# Patient Record
Sex: Male | Born: 1965 | Race: White | Hispanic: No | Marital: Married | Smoking: Current every day smoker
Health system: Southern US, Community
[De-identification: ages and names within clinical notes are randomized; demographics above are authoritative.]

## PROBLEM LIST (undated history)

## (undated) ENCOUNTER — Ambulatory Visit

## (undated) HISTORY — PX: ANKLE SURGERY: SHX546

---

## 2005-05-16 ENCOUNTER — Ambulatory Visit: Payer: Self-pay

## 2015-02-06 ENCOUNTER — Encounter: Payer: Self-pay | Admitting: Emergency Medicine

## 2015-02-06 ENCOUNTER — Emergency Department
Admission: EM | Admit: 2015-02-06 | Discharge: 2015-02-07 | Disposition: A | Discharge status: Home / Self Care | Payer: Self-pay | Attending: Emergency Medicine | Admitting: Emergency Medicine

## 2015-02-06 DIAGNOSIS — K2920 Alcoholic gastritis without bleeding: Secondary | ICD-10-CM | POA: Insufficient documentation

## 2015-02-06 DIAGNOSIS — F1010 Alcohol abuse, uncomplicated: Secondary | ICD-10-CM

## 2015-02-06 DIAGNOSIS — F10229 Alcohol dependence with intoxication, unspecified: Secondary | ICD-10-CM | POA: Insufficient documentation

## 2015-02-06 DIAGNOSIS — Z720 Tobacco use: Secondary | ICD-10-CM | POA: Insufficient documentation

## 2015-02-06 DIAGNOSIS — Z79899 Other long term (current) drug therapy: Secondary | ICD-10-CM | POA: Insufficient documentation

## 2015-02-06 LAB — CBC
HEMATOCRIT: 45 % (ref 40.0–52.0)
HEMOGLOBIN: 15.4 g/dL (ref 13.0–18.0)
MCH: 32.2 pg (ref 26.0–34.0)
MCHC: 34.2 g/dL (ref 32.0–36.0)
MCV: 94.1 fL (ref 80.0–100.0)
Platelets: 316 10*3/uL (ref 150–440)
RBC: 4.78 MIL/uL (ref 4.40–5.90)
RDW: 13.8 % (ref 11.5–14.5)
WBC: 8.8 10*3/uL (ref 3.8–10.6)

## 2015-02-06 LAB — COMPREHENSIVE METABOLIC PANEL
ALBUMIN: 4.1 g/dL (ref 3.5–5.0)
ALT: 73 U/L — AB (ref 17–63)
AST: 84 U/L — AB (ref 15–41)
Alkaline Phosphatase: 57 U/L (ref 38–126)
Anion gap: 12 (ref 5–15)
BILIRUBIN TOTAL: 0.7 mg/dL (ref 0.3–1.2)
BUN: 8 mg/dL (ref 6–20)
CO2: 21 mmol/L — ABNORMAL LOW (ref 22–32)
CREATININE: 0.75 mg/dL (ref 0.61–1.24)
Calcium: 8.6 mg/dL — ABNORMAL LOW (ref 8.9–10.3)
Chloride: 99 mmol/L — ABNORMAL LOW (ref 101–111)
GFR calc Af Amer: >60 mL/min (ref >60)
GLUCOSE: 87 mg/dL (ref 65–99)
Potassium: 3.7 mmol/L (ref 3.5–5.1)
Sodium: 132 mmol/L — ABNORMAL LOW (ref 135–145)
TOTAL PROTEIN: 7.1 g/dL (ref 6.5–8.1)

## 2015-02-06 LAB — ETHANOL: Alcohol, Ethyl (B): 238 mg/dL — ABNORMAL HIGH (ref <5)

## 2015-02-06 LAB — LIPASE, BLOOD: LIPASE: 31 U/L (ref 22–51)

## 2015-02-06 MED ORDER — LORAZEPAM 2 MG PO TABS: 0.0000 mg | ORAL_TABLET | Freq: Four times a day (QID) | ORAL | Status: DC

## 2015-02-06 MED ORDER — PANTOPRAZOLE SODIUM 40 MG PO TBEC
40.0000 mg | DELAYED_RELEASE_TABLET | Freq: Every day | ORAL | Status: DC
  Administered 2015-02-06: 40 mg via ORAL
  Filled 2015-02-06: qty 1

## 2015-02-06 MED ORDER — PANTOPRAZOLE SODIUM 40 MG PO TBEC: 40.0000 mg | DELAYED_RELEASE_TABLET | Freq: Every day | ORAL | Status: DC

## 2015-02-06 MED ORDER — ONDANSETRON 4 MG PO TBDP
4.0000 mg | ORAL_TABLET | Freq: Once | ORAL | Status: AC
  Administered 2015-02-06: 4 mg via ORAL
  Filled 2015-02-06: qty 1

## 2015-02-06 MED ORDER — THIAMINE HCL 100 MG/ML IJ SOLN: 100.0000 mg | Freq: Every day | INTRAMUSCULAR | Status: DC

## 2015-02-06 MED ORDER — LORAZEPAM 2 MG PO TABS: 0.0000 mg | ORAL_TABLET | Freq: Two times a day (BID) | ORAL | Status: DC

## 2015-02-06 MED ORDER — VITAMIN B-1 100 MG PO TABS
100.0000 mg | ORAL_TABLET | Freq: Every day | ORAL | Status: DC
  Administered 2015-02-06: 100 mg via ORAL
  Filled 2015-02-06: qty 1

## 2015-02-06 NOTE — ED Notes (Signed)
Pt. Noted in room. No complaints or concerns voiced. No distress or abnormal behavior noted. Will continue to monitor with security cameras. Q 15 minute rounds continue. 

## 2015-02-06 NOTE — BH Assessment (Signed)
Assessment Note  Christian Stewart is an 49 y.o. male presenting voluntarily to the ED for alcohol detox.  Pt reports he has been drinking a six pack of beer on a daily basis for the past 23 years.  Pt did not report any major issues with withdrawals.  Pt denies any other alcohol/drug uses.  Pt denies SI/HI.  Axis I: Alcohol Abuse Axis II: Deferred Axis III: History reviewed. No pertinent past medical history. Axis IV: problems with primary support group Axis V: 61-70 mild symptoms  Past Medical History: History reviewed. No pertinent past medical history.  History reviewed. No pertinent past surgical history.  Family History: History reviewed. No pertinent family history.  Social History:  reports that he has been smoking.  He does not have any smokeless tobacco history on file. He reports that he drinks alcohol. His drug history is not on file.  Additional Social History:  Alcohol / Drug Use History of alcohol / drug use?: Yes Longest period of sobriety (when/how long): 1 day Substance #1 Name of Substance 1: ETOH 1 - Age of First Use: 22 1 - Amount (size/oz): 6 pack 1 - Frequency: dailly 1 - Duration: all day 1 - Last Use / Amount: today  CIWA: CIWA-Ar BP: 120/72 mmHg Pulse Rate: 94 Nausea and Vomiting: no nausea and no vomiting (Pt here for alcohol detox, reports drinks between 12-24 beers a day. last drink 0900. Pt denies any complaints at this time. ) Tactile Disturbances: none Tremor: no tremor Auditory Disturbances: not present Paroxysmal Sweats: no sweat visible Visual Disturbances: not present Anxiety: no anxiety, at ease Headache, Fullness in Head: none present Agitation: normal activity Orientation and Clouding of Sensorium: oriented and can do serial additions CIWA-Ar Total: 0 COWS:    Allergies: No Known Allergies  Home Medications:  (Not in a hospital admission)  OB/GYN Status:  No LMP for male patient.  General Assessment Data Location of  Assessment: [facility] ED TTS Assessment: In system Is this a Tele or Face-to-Face Assessment?: Face-to-Face Is this an Initial Assessment or a Re-assessment for this encounter?: Initial Assessment Marital status: Married Christian Stewart name: N/a Is patient pregnant?: No Pregnancy Status: No Living Arrangements: Spouse/significant other Can pt return to current living arrangement?: Yes Admission Status: Voluntary Is patient capable of signing voluntary admission?: Yes Referral Source: Self/Family/Friend Insurance type: None  Medical Screening Exam [facility] Walk-in ONLY) Medical Exam completed: Yes  Crisis Care Plan Living Arrangements: Spouse/significant other Name of Psychiatrist: None Name of Therapist: None  Education Status Is patient currently in school?: No Current Grade: NA Highest grade of school patient has completed: college Contact person: Doctor, general practice  Risk to self with the past 6 months Suicidal Ideation: No Has patient been a risk to self within the past 6 months prior to admission? : No Suicidal Intent: No Has patient had any suicidal intent within the past 6 months prior to admission? : No Is patient at risk for suicide?: No Suicidal Plan?: No Has patient had any suicidal plan within the past 6 months prior to admission? : No Access to Means: No What has been your use of drugs/alcohol within the last 12 months?: 6 pack of beer daily Previous Attempts/Gestures: No How many times?: 0 Other Self Harm Risks: None reported Triggers for Past Attempts: None known Intentional Self Injurious Behavior: None Family Suicide History: No Recent stressful life event(s): Other (Comment) (None reported) Persecutory voices/beliefs?: No Depression: No Depression Symptoms: Guilt Substance abuse history and/or treatment for substance abuse?:  No Suicide prevention information given to non-admitted patients: Not applicable  Risk to Others within the past 6 months Homicidal Ideation:  No Does patient have any lifetime risk of violence toward others beyond the six months prior to admission? : No Thoughts of Harm to Others: No Current Homicidal Intent: No Current Homicidal Plan: No Access to Homicidal Means: No Identified Victim: None reported History of harm to others?: No Assessment of Violence: On admission Violent Behavior Description: None reported Does patient have access to weapons?: No Criminal Charges Pending?: No Does patient have a court date: No Is patient on probation?: No  Psychosis Hallucinations: None noted Delusions: None noted  Mental Status Report Appearance/Hygiene: In scrubs Eye Contact: Good Motor Activity: Freedom of movement Speech: Logical/coherent Level of Consciousness: Alert Mood: Pleasant Affect: Appropriate to circumstance Anxiety Level: None Thought Processes: Coherent Judgement: Unimpaired Orientation: Person, Place, Time, Situation Obsessive Compulsive Thoughts/Behaviors: None  Cognitive Functioning Concentration: Normal Memory: Recent Intact IQ: Average Insight: Good Impulse Control: Good Appetite: Good Weight Loss: 0 Weight Gain: 0 Sleep: No Change Total Hours of Sleep: 8 Vegetative Symptoms: None  ADLScreening [facility] Assessment Services) Patient's cognitive ability adequate to safely complete daily activities?: Yes Patient able to express need for assistance with ADLs?: Yes Independently performs ADLs?: Yes (appropriate for developmental age)  Prior Inpatient Therapy Prior Inpatient Therapy: No  Prior Outpatient Therapy Prior Outpatient Therapy: No Does patient have an ACCT team?: No Does patient have Intensive In-House Services?  : No Does patient have [facility] services? : No Does patient have P4CC services?: No  ADL Screening (condition at time of admission) Patient's cognitive ability adequate to safely complete daily activities?: Yes Patient able to express need for assistance with ADLs?:  Yes Independently performs ADLs?: Yes (appropriate for developmental age)       Abuse/Neglect Assessment (Assessment to be complete while patient is alone) Physical Abuse: Denies Verbal Abuse: Denies Sexual Abuse: Denies Exploitation of patient/patient's resources: Denies Self-Neglect: Denies Values / Beliefs Cultural Requests During Hospitalization: None Spiritual Requests During Hospitalization: None Consults Spiritual Care Consult Needed: No Social Work Consult Needed: No Merchant navy officer (For Healthcare) Does patient have an advance directive?: No Would patient like information on creating an advanced directive?: Yes English as a second language teacher given    Additional Information 1:1 In Past 12 Months?: No CIRT Risk: No Elopement Risk: No Does patient have medical clearance?: Yes     Disposition:  Disposition Initial Assessment Completed for this Encounter: Yes Disposition of Patient: Other dispositions, Referred to Other disposition(s): Referred to outside facility Patient referred to: [facility]  On Site Evaluation by:   Reviewed with Physician:    [place] 02/06/2015 8:24 PM

## 2015-02-06 NOTE — ED Notes (Signed)
Detox from alcohol. Last drink 1500

## 2015-02-06 NOTE — Discharge Instructions (Signed)
Alcohol Withdrawal  Anytime drug use is interfering with normal living activities it has become abuse. This includes problems with family and friends. Psychological dependence has developed when your mind tells you that the drug is needed. This is usually followed by physical dependence when a continuing increase of drugs are required to get the same feeling or "high." This is known as addiction or chemical dependency. A person's risk is much higher if there is a history of chemical dependency in the family.  Mild Withdrawal Following Stopping Alcohol, When Addiction or Chemical Dependency Has Developed  When a person has developed tolerance to alcohol, any sudden stopping of alcohol can cause uncomfortable physical symptoms. Most of the time these are mild and consist of tremors in the hands and increases in heart rate, breathing, and temperature. Sometimes these symptoms are associated with anxiety, panic attacks, and bad dreams. There may also be stomach upset. Normal sleep patterns are often interrupted with periods of inability to sleep (insomnia). This may last for 6 months. Because of this discomfort, many people choose to continue drinking to get rid of this discomfort and to try to feel normal.  Severe Withdrawal with Decreased or No Alcohol Intake, When Addiction or Chemical Dependency Has Developed  About five percent of alcoholics will develop signs of severe withdrawal when they stop using alcohol. One sign of this is development of generalized seizures (convulsions). Other signs of this are severe agitation and confusion. This may be associated with believing in things which are not real or seeing things which are not really there (delusions and hallucinations). Vitamin deficiencies are usually present if alcohol intake has been long-term. Treatment for this most often requires hospitalization and close observation.  Addiction can only be helped by stopping use of all chemicals. This is hard but may  save your life. With continual alcohol use, possible outcomes are usually loss of self respect and esteem, violence, and death.  Addiction cannot be cured but it can be stopped. This often requires outside help and the care of professionals. Treatment centers are listed in the yellow pages under Cocaine, Narcotics, and Alcoholics Anonymous. Most hospitals and clinics can refer you to a specialized care center.  It is not necessary for you to go through the uncomfortable symptoms of withdrawal. Your caregiver can provide you with medicines that will help you through this difficult period. Try to avoid situations, friends, or drugs that made it possible for you to keep using alcohol in the past. Learn how to say no.  It takes a long period of time to overcome addictions to all drugs, including alcohol. There may be many times when you feel as though you want a drink. After getting rid of the physical addiction and withdrawal, you will have a lessening of the craving which tells you that you need alcohol to feel normal. Call your caregiver if more support is needed. Learn who to talk to in your family and among your friends so that during these periods you can receive outside help. Alcoholics Anonymous (AA) has helped many people over the years. To get further help, contact AA or call your caregiver, counselor, or clergyperson. Al-Anon and Alateen are support groups for friends and family members of an alcoholic. The people who love and care for an alcoholic often need help, too. For information about these organizations, check your phone directory or call a local alcoholism treatment center.   SEEK IMMEDIATE MEDICAL CARE IF:    You have a seizure.     You have a fever.   You experience uncontrolled vomiting or you vomit up blood. This may be bright red or look like black coffee grounds.   You have blood in the stool. This may be bright red or appear as a black, tarry, bad-smelling stool.   You become lightheaded or  faint. Do not drive if you feel this way. Have someone else drive you or call 911 for help.   You become more agitated or confused.   You develop uncontrolled anxiety.   You begin to see things that are not really there (hallucinate).  Your caregiver has determined that you completely understand your medical condition, and that your mental state is back to normal. You understand that you have been treated for alcohol withdrawal, have agreed not to drink any alcohol for a minimum of 1 day, will not operate a car or other machinery for 24 hours, and have had an opportunity to ask any questions about your condition.  Document Released: 03/12/2005 Document Revised: 08/25/2011 Document Reviewed: 01/19/2008  ExitCare Patient Information 2015 [employer]. This information is not intended to replace advice given to you by your health care provider. Make sure you discuss any questions you have with your health care provider.    Gastritis, Adult  Gastritis is soreness and swelling (inflammation) of the lining of the stomach. Gastritis can develop as a sudden onset (acute) or long-term (chronic) condition. If gastritis is not treated, it can lead to stomach bleeding and ulcers.  CAUSES   Gastritis occurs when the stomach lining is weak or damaged. Digestive juices from the stomach then inflame the weakened stomach lining. The stomach lining may be weak or damaged due to viral or bacterial infections. One common bacterial infection is the Helicobacter pylori infection. Gastritis can also result from excessive alcohol consumption, taking certain medicines, or having too much acid in the stomach.   SYMPTOMS   In some cases, there are no symptoms. When symptoms are present, they may include:   Pain or a burning sensation in the upper abdomen.   Nausea.   Vomiting.   An uncomfortable feeling of fullness after eating.  DIAGNOSIS   Your caregiver may suspect you have gastritis based on your symptoms and a physical exam. To  determine the cause of your gastritis, your caregiver may perform the following:   Blood or stool tests to check for the H pylori bacterium.   Gastroscopy. A thin, flexible tube (endoscope) is passed down the esophagus and into the stomach. The endoscope has a light and camera on the end. Your caregiver uses the endoscope to view the inside of the stomach.   Taking a tissue sample (biopsy) from the stomach to examine under a microscope.  TREATMENT   Depending on the cause of your gastritis, medicines may be prescribed. If you have a bacterial infection, such as an H pylori infection, antibiotics may be given. If your gastritis is caused by too much acid in the stomach, H2 blockers or antacids may be given. Your caregiver may recommend that you stop taking aspirin, ibuprofen, or other nonsteroidal anti-inflammatory drugs (NSAIDs).  HOME CARE INSTRUCTIONS   Only take over-the-counter or prescription medicines as directed by your caregiver.   If you were given antibiotic medicines, take them as directed. Finish them even if you start to feel better.   Drink enough fluids to keep your urine clear or pale yellow.   Avoid foods and drinks that make your symptoms worse, such as:     Caffeine or alcoholic drinks.   Chocolate.   Peppermint or mint flavorings.   Garlic and onions.   Spicy foods.   Citrus fruits, such as oranges, lemons, or limes.   Tomato-based foods such as sauce, chili, salsa, and pizza.   Fried and fatty foods.   Eat small, frequent meals instead of large meals.  SEEK IMMEDIATE MEDICAL CARE IF:    You have black or dark red stools.   You vomit blood or material that looks like coffee grounds.   You are unable to keep fluids down.   Your abdominal pain gets worse.   You have a fever.   You do not feel better after 1 week.   You have any other questions or concerns.  MAKE SURE YOU:   Understand these instructions.   Will watch your condition.   Will get help right away if you are not  doing well or get worse.  Document Released: 05/27/2001 Document Revised: 12/02/2011 Document Reviewed: 07/16/2011  ExitCare Patient Information 2015 [employer]. This information is not intended to replace advice given to you by your health care provider. Make sure you discuss any questions you have with your health care provider.

## 2015-02-06 NOTE — ED Notes (Signed)

## 2015-02-06 NOTE — ED Provider Notes (Signed)
[facility] Emergency Department Provider Note  ____________________________________________  Time seen: Approximately 7:18 PM  I have reviewed the triage vital signs and the nursing notes.   HISTORY  Chief Complaint Alcohol addiction   HPI Christian Stewart is a 49 y.o. male denies medical history. He presents todayfor concerns that he needs to go through detox from alcohol use. He discussed with his wife, and they've concluded that he needs to have an inpatient detox as he drinks upwards of 12 beers per day. He last drank around 3 PM today. He has not yet experienced any seizures in his history, he denies withdrawal symptoms now or the shakes. No hallucinations. No desire to hurt himself or others.  Patient also reports that for the last couple of months he has noticed pain in his upper abdomen that wakes him up at about 4 in the morning. He has occasionally caused him to vomit, denies any blood in his vomit or dark stools. He is concerned that he is developed stomach upset from his drinking.   History reviewed. No pertinent past medical history.  There are no active problems to display for this patient.   History reviewed. No pertinent past surgical history.  Current Outpatient Rx  Name  Route  Sig  Dispense  Refill  . pantoprazole (PROTONIX) 40 MG tablet   Oral   Take 1 tablet (40 mg total) by mouth daily.   30 tablet   1     Allergies Review of patient's allergies indicates no known allergies.  History reviewed. No pertinent family history.  Social History Social History  Substance Use Topics  . Smoking status: Current Every Day Smoker  . Smokeless tobacco: None  . Alcohol Use: Yes    Review of Systems Constitutional: No fever/chills Eyes: No visual changes. ENT: No sore throat. Cardiovascular: Denies chest pain. Respiratory: Denies shortness of breath. Gastrointestinal: No abdominal pain now, but strikes him and his stomach at about  4 AM on a somewhat daily basis for the last 2-3 months.  No nausea, no vomiting.  No diarrhea.  No constipation. Genitourinary: Negative for dysuria. Musculoskeletal: Negative for back pain. Skin: Negative for rash. Neurological: Negative for headaches, focal weakness or numbness.  10-point ROS otherwise negative.  ____________________________________________   PHYSICAL EXAM:  VITAL SIGNS: ED Triage Vitals  Enc Vitals Group     BP 02/06/15 1758 112/77 mmHg     Pulse Rate 02/06/15 1758 96     Resp 02/06/15 1758 18     Temp 02/06/15 1758 98.1 F (36.7 C)     Temp Source 02/06/15 1758 Oral     SpO2 02/06/15 1758 96 %     Weight 02/06/15 1758 170 lb (77.111 kg)     Height 02/06/15 1758 5\' 11"  (1.803 m)     Head Cir --      Peak Flow --      Pain Score --      Pain Loc --      Pain Edu? --      Excl. in GC? --     Constitutional: Alert and oriented. Well appearing and in no acute distress. Eyes: Conjunctivae are normal. PERRL. EOMI. Head: Atraumatic. Nose: No congestion/rhinnorhea. Mouth/Throat: Mucous membranes are moist.  Oropharynx non-erythematous. Neck: No stridor.   Cardiovascular: Normal rate, regular rhythm. Grossly normal heart sounds.  Good peripheral circulation. Respiratory: Normal respiratory effort.  No retractions. Lungs CTAB. Gastrointestinal: Soft and nontender. No distention. No abdominal bruits. No CVA  tenderness. Musculoskeletal: No lower extremity tenderness nor edema.  No joint effusions. Neurologic:  Normal speech and language. No gross focal neurologic deficits are appreciated. No gait instability. Skin:  Skin is warm, dry and intact. No rash noted. Psychiatric: Mood and affect are normal. Speech and behavior are normal. Denies hallucinations, and he desires to hurt himself, or thoughts of hurting others. He is not suicidal.  ____________________________________________   LABS (all labs ordered are listed, but only abnormal results are  displayed)  Labs Reviewed  COMPREHENSIVE METABOLIC PANEL - Abnormal; Notable for the following:    Sodium 132 (*)    Chloride 99 (*)    CO2 21 (*)    Calcium 8.6 (*)    AST 84 (*)    ALT 73 (*)    All other components within normal limits  ETHANOL - Abnormal; Notable for the following:    Alcohol, Ethyl (B) 238 (*)    All other components within normal limits  CBC  LIPASE, BLOOD  ETHANOL   ____________________________________________  EKG   ____________________________________________  RADIOLOGY   ____________________________________________   PROCEDURES  Procedure(s) performed: None  Critical Care performed: No  ____________________________________________   INITIAL IMPRESSION / ASSESSMENT AND PLAN / ED COURSE  Pertinent labs & imaging results that were available during my care of the patient were reviewed by me and considered in my medical decision making (see chart for details).  Patient's abdominal pain is most likely consistent with alcoholic gastritis versus less likely pancreatitis or possibly gastric ulcer induced by his alcoholism. His exam is reassuring without pain or discomfort at this time. Regarding his alcohol use, I will consult TTS for detox options. He presently has a which all score of 0, showing no evidence of withdrawals. We will place him on withdrawal protocol, as we continue to await options. I will check basic labs because of his report of upper abdominal pain including lipase.  ----------------------------------------- 8:10 PM on 02/06/2015 -----------------------------------------  Patient was seen by TTS, they advised the patient can go to [facility] where there is an open and available detox bed awaiting him. The patient reports that this would work well for him, his wife is able to drive him there. Patient agrees not to drive himself.  ----------------------------------------- 10:59 PM on  02/06/2015 -----------------------------------------  Patient agreeable with plan to go to [facility] this evening. We do need to wait for his alcohol level to be less than 100, and will recheck at 2 AM.  We contact [facility], there able to take him now. We'll discharge the patient [facility]. ____________________________________________   FINAL CLINICAL IMPRESSION(S) / ED DIAGNOSES  Final diagnoses:  Alcoholic gastritis  Alcohol abuse      Sharyn Creamer, MD 02/06/15 [phone]

## 2015-02-06 NOTE — ED Notes (Signed)
Report called to RN Jillyn Hidden in [place]. Pt to move to [facility] with ED Tech Russ A.

## 2015-02-06 NOTE — ED Notes (Signed)
Pt. To [facility] from ED ambulatory without difficulty, to room 3 . Report from Beth RN. Pt. Is alert and oriented, warm and dry in no distress. Pt. Denies SI, HI, and AVH. Pt. Calm and cooperative. Pt. Made aware of security cameras and Q15 minute rounds. Pt. Encouraged to let Nursing staff know of any concerns or needs.  

## 2015-02-07 NOTE — ED Notes (Signed)
Pt. Noted sleeping in room. No complaints or concerns voiced. No distress or abnormal behavior noted. Will continue to monitor with security cameras. Q 15 minute rounds continue. 

## 2016-07-30 ENCOUNTER — Encounter (HOSPITAL_COMMUNITY): Payer: Self-pay | Admitting: Emergency Medicine

## 2016-07-30 ENCOUNTER — Ambulatory Visit (HOSPITAL_COMMUNITY)
Admission: EM | Admit: 2016-07-30 | Discharge: 2016-07-30 | Disposition: A | Discharge status: Home / Self Care | Payer: 59 | Attending: Family Medicine | Admitting: Family Medicine

## 2016-07-30 DIAGNOSIS — H61102 Unspecified noninfective disorders of pinna, left ear: Secondary | ICD-10-CM

## 2016-07-30 MED ORDER — CEPHALEXIN 500 MG PO CAPS: 500.0000 mg | ORAL_CAPSULE | Freq: Four times a day (QID) | ORAL | 0 refills | Status: DC

## 2016-07-30 NOTE — Discharge Instructions (Signed)
Apply heat and take antibiotic and call specialist for further care.

## 2016-07-30 NOTE — ED Triage Notes (Signed)
[facility] a [facility] of [facility] possible cyst on his left ear x 3 days.

## 2016-07-30 NOTE — ED Provider Notes (Signed)
[facility]    CSN: [id number] Arrival date & time: 07/30/16  1821     History   Chief Complaint Chief Complaint  Patient presents with  . Cyst    HPI Christian Stewart is a 51 y.o. male.    Rash  Location:  Head/neck Head/neck rash location:  L ear Quality: painful and swelling   Pain details:    Severity:  Mild   Duration:  5 days   Progression:  Worsening Severity:  Mild Onset quality:  Gradual Duration:  5 days Progression:  Worsening Chronicity:  New   History reviewed. No pertinent past medical history.  There are no active problems to display for this patient.   History reviewed. No pertinent surgical history.     Home Medications    Prior to Admission medications   Not on File    Family History History reviewed. No pertinent family history.  Social History Social History  Substance Use Topics  . Smoking status: Current Every Day Smoker  . Smokeless tobacco: Not on file  . Alcohol use Yes     Allergies   Patient has no known allergies.   Review of Systems Review of Systems  HENT: Positive for ear pain.   Skin: Positive for rash.  All other systems reviewed and are negative.    Physical Exam Triage Vital Signs ED Triage Vitals  Enc Vitals Group     BP 07/30/16 1858 151/89     Pulse Rate 07/30/16 1858 80     Resp 07/30/16 1858 18     Temp 07/30/16 1858 97.3 F (36.3 C)     Temp Source 07/30/16 1858 Oral     SpO2 07/30/16 1858 99 %     Weight --      Height --      Head Circumference --      Peak Flow --      Pain Score 07/30/16 1857 0     Pain Loc --      Pain Edu? --      Excl. in GC? --    No data found.   Updated Vital Signs BP 151/89 (BP Location: Right Arm)   Pulse 80   Temp 97.3 F (36.3 C) (Oral)   Resp 18   SpO2 99%   Visual Acuity Right Eye Distance:   Left Eye Distance:   Bilateral Distance:    Right Eye Near:   Left Eye Near:    Bilateral Near:     Physical Exam    Constitutional: He appears well-developed and well-nourished.  Neck: Normal range of motion. Neck supple.  Lymphadenopathy:    He has no cervical adenopathy.  Skin: Skin is warm. There is erythema.  Tender 1cm cystic lesion to lower pole of left pinna  Nursing note and vitals reviewed.    UC Treatments / Results  Labs (all labs ordered are listed, but only abnormal results are displayed) Labs Reviewed - No data to display  EKG  EKG Interpretation None       Radiology No results found.  Procedures Procedures (including critical care time)  Medications Ordered in UC Medications - No data to display   Initial Impression / Assessment and Plan / UC Course  I have reviewed the triage vital signs and the nursing notes.  Pertinent labs & imaging results that were available during my care of the patient were reviewed by me and considered in my medical decision making (see chart for details).  Final Clinical Impressions(s) / UC Diagnoses   Final diagnoses:  None    New Prescriptions New Prescriptions   No medications on file     Linna Hoff, MD 07/30/16 1946

## 2016-10-31 ENCOUNTER — Emergency Department (HOSPITAL_COMMUNITY): Payer: 59

## 2016-10-31 ENCOUNTER — Emergency Department (HOSPITAL_COMMUNITY)
Admission: EM | Admit: 2016-10-31 | Discharge: 2016-10-31 | Discharge status: Against Medical Advice | Payer: 59 | Attending: Emergency Medicine | Admitting: Emergency Medicine

## 2016-10-31 ENCOUNTER — Encounter (HOSPITAL_COMMUNITY): Payer: Self-pay | Admitting: Emergency Medicine

## 2016-10-31 DIAGNOSIS — M545 Low back pain, unspecified: Secondary | ICD-10-CM

## 2016-10-31 DIAGNOSIS — F17200 Nicotine dependence, unspecified, uncomplicated: Secondary | ICD-10-CM | POA: Diagnosis not present

## 2016-10-31 NOTE — ED Provider Notes (Signed)
[facility] Provider Note   CSN: [id number] Arrival date & time: 10/31/16  1253     History   Chief Complaint Chief Complaint  Patient presents with  . Back Pain    HPI Christian Stewart is a 51 y.o. male.  The history is provided by the patient. No language interpreter was used.  Back Pain   This is a new problem. Episode onset: 4 days ago. The problem occurs constantly. The problem has not changed since onset.Associated with: lifting right leg to put on sock. The pain is present in the lumbar spine. The quality of the pain is described as aching. The pain does not radiate. The pain is at a severity of 10/10 (in the morning or at rest). Exacerbated by: rest. Stiffness is present in the morning. Pertinent negatives include no chest pain, no fever, no numbness, no weight loss, no headaches, no abdominal pain, no abdominal swelling, no bowel incontinence, no perianal numbness, no bladder incontinence, no dysuria, no pelvic pain, no leg pain, no paresthesias, no paresis, no tingling and no weakness. He has tried NSAIDs for the symptoms. The treatment provided no relief. Risk factors: no hx of CA, no hx of IVDU.    History reviewed. No pertinent past medical history.  There are no active problems to display for this patient.   Past Surgical History:  Procedure Laterality Date  . ANKLE SURGERY         Home Medications    Prior to Admission medications   Medication Sig Start Date End Date Taking? Authorizing Provider  cephALEXin (KEFLEX) 500 MG capsule Take 1 capsule (500 mg total) by mouth 4 (four) times daily. 07/30/16   Linna HoffKindl, James D, MD    Family History Family History  Problem Relation Age of Onset  . Hypertension Father     Social History Social History  Substance Use Topics  . Smoking status: Current Every Day Smoker    Packs/day: 0.50  . Smokeless tobacco: Never Used  . Alcohol use Yes     Comment: weekly     Allergies   Patient has no known  allergies.   Review of Systems Review of Systems  Constitutional: Negative for fever and weight loss.  Cardiovascular: Negative for chest pain.  Gastrointestinal: Negative for abdominal pain and bowel incontinence.  Genitourinary: Negative for bladder incontinence, dysuria and pelvic pain.  Musculoskeletal: Positive for back pain.  Neurological: Negative for tingling, weakness, numbness, headaches and paresthesias.     Physical Exam Updated Vital Signs BP 121/81 (BP Location: Right Arm)   Pulse 82   Temp 97.7 F (36.5 C)   Resp 18   Wt 170 lb (77.1 kg)   SpO2 97%   BMI 23.71 kg/m   Physical Exam  Constitutional: He is oriented to person, place, and time. He appears well-developed and well-nourished. No distress.  Well appearing  HENT:  Head: Normocephalic and atraumatic.  Nose: Nose normal.  Mouth/Throat: Oropharynx is clear and moist.  Eyes: EOM are normal. Pupils are equal, round, and reactive to light.  Neck: Normal range of motion. No JVD present.  Normal ROM, no neck tenderness. No nuchal rigidity  Cardiovascular: Normal rate, normal heart sounds and intact distal pulses.   No murmur heard. Pulmonary/Chest: Effort normal and breath sounds normal. No respiratory distress. He has no wheezes. He has no rales.  Normal work of breathing  Abdominal: Soft. Bowel sounds are normal. There is no tenderness. There is no rebound and no guarding.  Soft and nontender. No rebound or guarding. No pulsatile mass noted.   Musculoskeletal: He exhibits tenderness. He exhibits no deformity.   No midline cervical, thoracic, or lumbar tenderness. Good ROM of spine. No deformity. No obvious wound, redness, or swelling noted.   Neurological: He is alert and oriented to person, place, and time.  Cranial Nerves:  III,IV, VI: ptosis not present, extra-ocular movements intact bilaterally, direct and consensual pupillary light reflexes intact bilaterally V: facial sensation, jaw opening, and  bite strength equal bilaterally VII: eyebrow raise, eyelid close, smile, frown, pucker equal bilaterally VIII: hearing grossly normal bilaterally  IX,X: palate elevation and swallowing intact XI: bilateral shoulder shrug and lateral head rotation equal and strong XII: midline tongue extension  Negative pronator drift, negative Romberg, negative RAM's, negative heel-to-shin, negative finger to nose.    Sensory intact.  Muscle strength 5/5 Patient able to ambulate without difficulty.   SLR Right -  neg SLR Left -  neg  Skin: Skin is warm.  Psychiatric: He has a normal mood and affect. His behavior is normal.  Nursing note and vitals reviewed.    ED Treatments / Results  Labs (all labs ordered are listed, but only abnormal results are displayed) Labs Reviewed - No data to display  EKG  EKG Interpretation None       Radiology No results found.  Procedures Procedures (including critical care time)  Medications Ordered in ED Medications - No data to display   Initial Impression / Assessment and Plan / ED Course  I have reviewed the triage vital signs and the nursing notes.  Pertinent labs & imaging results that were available during my care of the patient were reviewed by me and considered in my medical decision making (see chart for details).     Patient eloped. Patient was scheduled to have a lumbar x-ray done which was verbally understood and in agreement prior to ordering. Therefore not able to finish assessment. Patient was observed leaving the ED by nurse and questioned them on where they were going. There was acknowledgement however did not stop to speak with her.  Patient did not tolerate anyone he was leaving and therefore was not able to speak to him regarding the importance of getting the x-ray done and having the results back for further assessment.   Patient with back pain.  No neurological deficits and normal neuro exam.  Patient is ambulatory.  No loss of  bowel or bladder control.  No concern for cauda equina.  No fever, night sweats, weight loss, h/o cancer, IVDA, no recent procedure to back. No urinary symptoms suggestive of UTI.      Final Clinical Impressions(s) / ED Diagnoses   Final diagnoses:  Acute bilateral low back pain without sciatica    New Prescriptions Discharge Medication List as of 10/31/2016  2:42 PM       Alvina Chou, [place] 10/31/16 1459    Little, Ambrose Finland, MD 10/31/16 1743

## 2016-10-31 NOTE — ED Triage Notes (Signed)
Pt observed leaving the emergency department. Family acknowledged this RN but would not stop to speak with me.

## 2016-10-31 NOTE — ED Triage Notes (Signed)
Pt did not return to department. PA advised

## 2016-10-31 NOTE — ED Triage Notes (Signed)
Pt reports low back pain x 3 days. Denies trauma. Pain stated when he was lifting r/leg 3 days ago

## 2016-11-01 ENCOUNTER — Encounter (HOSPITAL_COMMUNITY): Payer: Self-pay | Admitting: Emergency Medicine

## 2016-11-01 ENCOUNTER — Emergency Department (HOSPITAL_COMMUNITY)
Admission: EM | Admit: 2016-11-01 | Discharge: 2016-11-01 | Disposition: A | Discharge status: Home / Self Care | Payer: 59 | Attending: Emergency Medicine | Admitting: Emergency Medicine

## 2016-11-01 DIAGNOSIS — X501XXA Overexertion from prolonged static or awkward postures, initial encounter: Secondary | ICD-10-CM | POA: Diagnosis not present

## 2016-11-01 DIAGNOSIS — Z79899 Other long term (current) drug therapy: Secondary | ICD-10-CM | POA: Insufficient documentation

## 2016-11-01 DIAGNOSIS — Y999 Unspecified external cause status: Secondary | ICD-10-CM | POA: Insufficient documentation

## 2016-11-01 DIAGNOSIS — Y929 Unspecified place or not applicable: Secondary | ICD-10-CM | POA: Diagnosis not present

## 2016-11-01 DIAGNOSIS — S39012A Strain of muscle, fascia and tendon of lower back, initial encounter: Secondary | ICD-10-CM

## 2016-11-01 DIAGNOSIS — S3992XA Unspecified injury of lower back, initial encounter: Secondary | ICD-10-CM | POA: Diagnosis present

## 2016-11-01 DIAGNOSIS — Y939 Activity, unspecified: Secondary | ICD-10-CM | POA: Diagnosis not present

## 2016-11-01 DIAGNOSIS — F17200 Nicotine dependence, unspecified, uncomplicated: Secondary | ICD-10-CM | POA: Insufficient documentation

## 2016-11-01 MED ORDER — CYCLOBENZAPRINE HCL 10 MG PO TABS
5.0000 mg | ORAL_TABLET | Freq: Once | ORAL | Status: AC
  Administered 2016-11-01: 5 mg via ORAL
  Filled 2016-11-01: qty 1

## 2016-11-01 MED ORDER — CYCLOBENZAPRINE HCL 10 MG PO TABS: 10.0000 mg | ORAL_TABLET | Freq: Two times a day (BID) | ORAL | 0 refills | Status: DC | PRN

## 2016-11-01 MED ORDER — IBUPROFEN 600 MG PO TABS: 600.0000 mg | ORAL_TABLET | Freq: Four times a day (QID) | ORAL | 0 refills | Status: DC | PRN

## 2016-11-01 MED ORDER — PREDNISONE 20 MG PO TABS: ORAL_TABLET | ORAL | 0 refills | Status: DC

## 2016-11-01 MED ORDER — IBUPROFEN 800 MG PO TABS
800.0000 mg | ORAL_TABLET | Freq: Once | ORAL | Status: AC
  Administered 2016-11-01: 800 mg via ORAL
  Filled 2016-11-01: qty 1

## 2016-11-01 NOTE — ED Triage Notes (Signed)
Pt. Stated, I bent over to put my sock on Monday and it must have twicked my back, can't hardly stand to walk. [facility] and was only given Ibuprofen.

## 2016-11-01 NOTE — ED Notes (Signed)
Declined W/C at D/C and was escorted to lobby by RN. 

## 2016-11-01 NOTE — ED Provider Notes (Signed)
[facility] Provider Note    By signing my name below, I, Earmon Phoenix, attest that this documentation has been prepared under the direction and in the presence of Fayrene Helper, PA-C. Electronically Signed: Earmon Phoenix, ED Scribe. 11/01/16. 11:24 AM.   History   Chief Complaint Chief Complaint  Patient presents with  . Back Pain    The history is provided by the patient and medical records. No language interpreter was used.    Christian Stewart is a 51 y.o. male who presents to the Emergency Department complaining of low back pain that began five days ago. He rates the pain at 10/10 and describes it as soreness. He states he was putting on his socks when he moved the wrong way causing the pain to start. Pt was seen at [facility] yesterday but eloped from the department prior to having the scheduled imaging that was agreed upon during exam. He states he went to a chiropractor yesterday and was told L-1, L-2 and L-3 were out of alignment. He has taken Ibuprofen and has been wearing a back brace for pain with no significant relief. Moving increases the pain. He denies alleviating factors. He denies fever, chills, nausea, vomiting, bowel or bladder incontinence, numbness, tingling or weakness of any extremity. He denies h/o IV drug use or cancer.   History reviewed. No pertinent past medical history.  There are no active problems to display for this patient.   Past Surgical History:  Procedure Laterality Date  . ANKLE SURGERY         Home Medications    Prior to Admission medications   Medication Sig Start Date End Date Taking? Authorizing Provider  cephALEXin (KEFLEX) 500 MG capsule Take 1 capsule (500 mg total) by mouth 4 (four) times daily. 07/30/16   Linna Hoff, MD  cyclobenzaprine (FLEXERIL) 10 MG tablet Take 1 tablet (10 mg total) by mouth 2 (two) times daily as needed for muscle spasms. 11/01/16   Fayrene Helper, PA-C  ibuprofen (ADVIL,MOTRIN) 600 MG tablet Take 1  tablet (600 mg total) by mouth every 6 (six) hours as needed. 11/01/16   Fayrene Helper, PA-C  predniSONE (DELTASONE) 20 MG tablet 2 tabs po daily x 4 days 11/01/16   Fayrene Helper, PA-C    Family History Family History  Problem Relation Age of Onset  . Hypertension Father     Social History Social History  Substance Use Topics  . Smoking status: Current Every Day Smoker    Packs/day: 0.50  . Smokeless tobacco: Never Used  . Alcohol use Yes     Comment: weekly     Allergies   Patient has no known allergies.   Review of Systems Review of Systems  Constitutional: Negative for chills and fever.  Gastrointestinal: Negative for nausea and vomiting.       No bowel or bladder incontinence  Musculoskeletal: Positive for back pain.  Neurological: Negative for weakness and numbness.     Physical Exam Updated Vital Signs BP (!) 168/106 (BP Location: Right Arm)   Pulse 77   Temp 97.8 F (36.6 C) (Oral)   Resp 16   Ht 5\' 11"  (1.803 m)   Wt 170 lb (77.1 kg)   SpO2 100%   BMI 23.71 kg/m   Physical Exam  Constitutional: He is oriented to person, place, and time. He appears well-developed and well-nourished.  HENT:  Head: Normocephalic and atraumatic.  Neck: Normal range of motion.  Cardiovascular: Normal rate.   DP pulses intact  bilaterally.  Pulmonary/Chest: Effort normal.  Musculoskeletal: Normal range of motion. He exhibits tenderness. He exhibits no edema or deformity.  TTP to L-4 and L-5 of the lumbar spine. No crepitus, no step off, no overlying skin changes.  Neurological: He is alert and oriented to person, place, and time.  Patellar DTRs intact bilaterally. Strength 5/5 to BLE. Sensations intact.  Skin: Skin is warm and dry.  Psychiatric: He has a normal mood and affect. His behavior is normal.  Nursing note and vitals reviewed.    ED Treatments / Results  DIAGNOSTIC STUDIES: Oxygen Saturation is 100% on RA, normal by my interpretation.   COORDINATION OF  CARE: 11:21 AM- Will prescribe steroid, muscle relaxer and Ibuprofen. Recommended heat/ice therapy. Will refer to orthopedist. Will provide work note. Pt verbalizes understanding and agrees to plan.  Medications - No data to display  Labs (all labs ordered are listed, but only abnormal results are displayed) Labs Reviewed - No data to display  EKG  EKG Interpretation None       Radiology No results found.  Procedures Procedures (including critical care time)  Medications Ordered in ED Medications - No data to display   Initial Impression / Assessment and Plan / ED Course  I have reviewed the triage vital signs and the nursing notes.  Pertinent labs & imaging results that were available during my care of the patient were reviewed by me and considered in my medical decision making (see chart for details).     Patient with back pain. No neurological deficits and normal neuro exam. Patient is ambulatory. No loss of bowel or bladder control. No concern for cauda equina.  No fever, night sweats, weight loss, h/o cancer, IVDA, no recent procedure to back. No urinary symptoms suggestive of UTI. Supportive care and return precaution discussed. Appears safe for discharge at this time. Follow up as indicated in discharge paperwork.    Final Clinical Impressions(s) / ED Diagnoses   Final diagnoses:  Strain of lumbar region, initial encounter    New Prescriptions New Prescriptions   CYCLOBENZAPRINE (FLEXERIL) 10 MG TABLET    Take 1 tablet (10 mg total) by mouth 2 (two) times daily as needed for muscle spasms.   IBUPROFEN (ADVIL,MOTRIN) 600 MG TABLET    Take 1 tablet (600 mg total) by mouth every 6 (six) hours as needed.   PREDNISONE (DELTASONE) 20 MG TABLET    2 tabs po daily x 4 days    I personally performed the services described in this documentation, which was scribed in my presence. The recorded information has been reviewed and is accurate.       Fayrene Helperran, Jazyah Butsch,  PA-C 11/01/16 1127    Rolan BuccoBelfi, Melanie, MD 11/01/16 [phone]

## 2016-11-04 ENCOUNTER — Encounter (INDEPENDENT_AMBULATORY_CARE_PROVIDER_SITE_OTHER): Payer: Self-pay | Admitting: Specialist

## 2016-11-04 ENCOUNTER — Ambulatory Visit (INDEPENDENT_AMBULATORY_CARE_PROVIDER_SITE_OTHER): Payer: 59 | Admitting: Specialist

## 2016-11-04 ENCOUNTER — Ambulatory Visit (INDEPENDENT_AMBULATORY_CARE_PROVIDER_SITE_OTHER): Payer: 59

## 2016-11-04 VITALS — BP 128/82 | HR 101 | Ht 71.0 in | Wt 170.0 lb

## 2016-11-04 DIAGNOSIS — M545 Low back pain: Secondary | ICD-10-CM | POA: Diagnosis not present

## 2016-11-04 MED ORDER — CYCLOBENZAPRINE HCL 10 MG PO TABS: 10.0000 mg | ORAL_TABLET | Freq: Two times a day (BID) | ORAL | 0 refills | Status: DC | PRN

## 2016-11-04 MED ORDER — TRAMADOL HCL 50 MG PO TABS: 50.0000 mg | ORAL_TABLET | Freq: Four times a day (QID) | ORAL | 0 refills | Status: DC | PRN

## 2016-11-04 NOTE — Patient Instructions (Addendum)
Avoid bending, stooping and avoid lifting weights greater than 10 lbs. Avoid prolong standing and walking. Avoid frequent bending and stooping  No lifting greater than 10 lbs. May use ice or moist heat for pain. Weight loss is of benefit. Physical therapy is ordered.

## 2016-11-04 NOTE — Progress Notes (Signed)
Office Visit Note   Patient: Christian Stewart           Date of Birth: November 24, 1965           MRN: [id number] Visit Date: 11/04/2016              Requested by: No referring provider defined for this encounter. PCP: Patient, No Pcp Per   Assessment & Plan: Visit Diagnoses:  1. Acute midline low back pain, with sciatica presence unspecified     Plan: Avoid bending, stooping and avoid lifting weights greater than 10 lbs. Avoid prolong standing and walking. Avoid frequent bending and stooping  No lifting greater than 10 lbs. May use ice or moist heat for pain. Weight loss is of benefit.  Follow-Up Instructions: No Follow-up on file.   Orders:  Orders Placed This Encounter  Procedures  . XR Lumbar Spine 2-3 Views   No orders of the defined types were placed in this encounter.     Procedures: No procedures performed   Clinical Data: No additional findings.   Subjective: Chief Complaint  Patient presents with  . Lower Back - Pain    DOI: 10/27/16 when he twisted wrong way while putting on sock--    51 year old male was bending over to tie his shoe getting ready for work last week and felt pain into his back pain is in the small of the back and not radiating into the legs. No numbness or paresthesias, no bowel or bladder. Pain is improving compared with this weekend. Was seen in the ER at [facility] on Saturday, was placed on prednisone, ibuprofen And cyclobenzaprine. Was at [facility] on Friday but not happy with care so he left and returned to [facility]. No history of scoliosis or back injury. Cough or sneeze with pain. No leg weakness. He works as a Development worker, community. Its a medium-heavy.    Review of Systems  Constitutional: Negative.  Negative for chills, diaphoresis and unexpected weight change.  HENT: Negative.  Negative for rhinorrhea, sinus pain and sinus pressure.   Eyes: Negative.   Respiratory: Negative.  Negative for  shortness of breath and wheezing.   Cardiovascular: Negative.  Negative for chest pain, palpitations and leg swelling.  Gastrointestinal: Negative.   Endocrine: Negative.   Genitourinary: Negative.   Musculoskeletal: Positive for back pain and gait problem.  Skin: Negative.   Allergic/Immunologic: Negative.   Neurological: Negative for weakness and numbness.  Hematological: Negative.  Negative for adenopathy. Does not bruise/bleed easily.  Psychiatric/Behavioral: Negative.      Objective: Vital Signs: BP 128/82 (BP Location: Left Arm, Patient Position: Sitting)   Pulse (!) 101   Ht 5\' 11"  (1.803 m)   Wt 170 lb (77.1 kg)   BMI 23.71 kg/m   Physical Exam  Constitutional: He is oriented to person, place, and time. He appears well-developed and well-nourished.  HENT:  Head: Normocephalic and atraumatic.  Eyes: EOM are normal. Pupils are equal, round, and reactive to light.  Neck: Normal range of motion. Neck supple.  Pulmonary/Chest: Effort normal and breath sounds normal.  Abdominal: Soft. Bowel sounds are normal.  Neurological: He is alert and oriented to person, place, and time.  Skin: Skin is warm and dry.  Psychiatric: He has a normal mood and affect. His behavior is normal. Judgment and thought content normal.    Back Exam   Tenderness  The patient is experiencing tenderness in the lumbar.  Range of Motion  Extension:  20 abnormal  Flexion: 80  Lateral Bend Right: normal  Lateral Bend Left: normal  Rotation Right: normal  Rotation Left: normal   Muscle Strength  Right Quadriceps:  5/5  Left Quadriceps:  5/5  Right Hamstrings:  5/5  Left Hamstrings:  5/5   Tests  Straight leg raise right: negative Straight leg raise left: negative  Reflexes  Patellar: normal Achilles: normal Babinski's sign: normal   Other  Toe Walk: normal Heel Walk: normal Sensation: normal Gait: normal  Erythema: no back redness Scars: absent      Specialty Comments:  No  specialty comments available.  Imaging: No results found.   PMFS History: There are no active problems to display for this patient.  No past medical history on file.  Family History  Problem Relation Age of Onset  . Hypertension Father     Past Surgical History:  Procedure Laterality Date  . ANKLE SURGERY     Social History   Occupational History  . Not on file.   Social History Main Topics  . Smoking status: Current Every Day Smoker    Packs/day: 0.50  . Smokeless tobacco: Never Used  . Alcohol use Yes     Comment: weekly  . Drug use: No  . Sexual activity: Not on file

## 2016-11-06 ENCOUNTER — Telehealth: Payer: Self-pay | Admitting: Physical Therapy

## 2016-11-06 NOTE — Telephone Encounter (Signed)
Attempted to reach patient on 11/04/16 & 11/06/16, phone out of service, unable to leave message

## 2016-11-26 ENCOUNTER — Ambulatory Visit (INDEPENDENT_AMBULATORY_CARE_PROVIDER_SITE_OTHER): Payer: 59 | Admitting: Specialist

## 2017-08-28 ENCOUNTER — Encounter: Payer: Self-pay | Admitting: Gastroenterology

## 2017-10-16 ENCOUNTER — Ambulatory Visit: Payer: 59 | Admitting: Gastroenterology

## 2018-07-02 ENCOUNTER — Telehealth: Payer: Self-pay | Admitting: Internal Medicine

## 2018-07-02 ENCOUNTER — Ambulatory Visit (INDEPENDENT_AMBULATORY_CARE_PROVIDER_SITE_OTHER)
Admission: RE | Admit: 2018-07-02 | Discharge: 2018-07-02 | Disposition: A | Discharge status: Home / Self Care | Payer: 59 | Source: Ambulatory Visit | Attending: Internal Medicine | Admitting: Internal Medicine

## 2018-07-02 ENCOUNTER — Encounter: Payer: Self-pay | Admitting: Internal Medicine

## 2018-07-02 ENCOUNTER — Ambulatory Visit: Payer: 59 | Admitting: Internal Medicine

## 2018-07-02 VITALS — BP 116/68 | HR 80 | Ht 71.0 in | Wt 186.4 lb

## 2018-07-02 DIAGNOSIS — R059 Cough, unspecified: Secondary | ICD-10-CM

## 2018-07-02 DIAGNOSIS — Z87891 Personal history of nicotine dependence: Secondary | ICD-10-CM

## 2018-07-02 DIAGNOSIS — R05 Cough: Secondary | ICD-10-CM

## 2018-07-02 DIAGNOSIS — R0989 Other specified symptoms and signs involving the circulatory and respiratory systems: Secondary | ICD-10-CM

## 2018-07-02 LAB — CBC WITH DIFFERENTIAL/PLATELET
BASOS PCT: 1 % (ref 0.0–3.0)
Basophils Absolute: 0.1 10*3/uL (ref 0.0–0.1)
EOS PCT: 3.3 % (ref 0.0–5.0)
Eosinophils Absolute: 0.3 10*3/uL (ref 0.0–0.7)
HCT: 45.9 % (ref 39.0–52.0)
Hemoglobin: 16 g/dL (ref 13.0–17.0)
Lymphocytes Relative: 31 % (ref 12.0–46.0)
Lymphs Abs: 3.1 10*3/uL (ref 0.7–4.0)
MCHC: 34.7 g/dL (ref 30.0–36.0)
MCV: 88.7 fl (ref 78.0–100.0)
Monocytes Absolute: 1.7 10*3/uL — ABNORMAL HIGH (ref 0.1–1.0)
Monocytes Relative: 17.1 % — ABNORMAL HIGH (ref 3.0–12.0)
Neutro Abs: 4.7 10*3/uL (ref 1.4–7.7)
Neutrophils Relative %: 47.6 % (ref 43.0–77.0)
PLATELETS: 478 10*3/uL — AB (ref 150.0–400.0)
RBC: 5.18 Mil/uL (ref 4.22–5.81)
RDW: 13 % (ref 11.5–15.5)
WBC: 9.9 10*3/uL (ref 4.0–10.5)

## 2018-07-02 LAB — NITRIC OXIDE: Nitric Oxide: 10

## 2018-07-02 MED ORDER — AZITHROMYCIN 250 MG PO TABS: ORAL_TABLET | ORAL | 0 refills | Status: DC

## 2018-07-02 MED ORDER — PREDNISONE 10 MG PO TABS: ORAL_TABLET | ORAL | 0 refills | Status: DC

## 2018-07-02 NOTE — Telephone Encounter (Signed)
Attempted to call pt but unable to reach. Left message for pt to return call. 

## 2018-07-02 NOTE — Patient Instructions (Addendum)
ICD-10-CM   1. History of smoking 30 or more pack years Z87.891   2. Cough R05   3. Pleural rub R09.89    Likely post viral reactive cough Rule out copd or pneumonia or pleurisy  PLAN  Do cbc with diff  Do CXR 2 view  Z pak  Please take prednisone 40 mg x1 day, then 30 mg x1 day, then 20 mg x1 day, then 10 mg x1 day, and then 5 mg x1 day and stop  Do PFT in 6 weeks  Followup PFT in 6 weeks Return to see me or nurse practitioner in 6 weeks

## 2018-07-02 NOTE — Progress Notes (Signed)
Subjective:    Patient ID: Christian Stewart, male    DOB: [birth year], 53 y.o.   MRN: [id number]  PCP Patient, No Pcp Per   HPI  IOV 07/02/2018  Chief Complaint  Patient presents with  . Consult    Pt is here today due to a cough which he has had for about 5-6 days which is productive and coughing up clear mucus. Pt denies any problems with SOB or CP.    Christian Stewart - 53 y.o.  -is a new evaluation for acute cough.  He presents with his friend Christian Stewart.  They both tell me that his father is a patient of Dr. Sandrea Hughs for chronic cough and COPD.  Patient has an acute cough and his father was very concerned and therefore insisted patient come to pulmonary clinic.  Patient does not have a primary care physician.  He works as a Psychologist, occupational and is a heavy smoker.  Apparently just over 2 weeks ago his significant other had sinusitis and then he picked up flulike symptoms with fever body aches runny nose.  This persisted for a week.  He did not take any antibiotics or antiviral agents for this.  Symptoms lasted a week and did settle and then a few days later and now ongoing for the last 5 days he has had severe cough.  His RSI cough score is 28.  Cough is present a day and night.  It is largely dry.  Associated with wheezing but no dyspnea.  Wakes him up in the middle of the night as well.  He is extremely bothered by the cough.   Dr Gretta Cool Reflux Symptom Index (> 13-15 suggestive of LPR cough) 0 -> 5  =  none ->severe problem 07/02/2018   Hoarseness of problem with voice 0  Clearing  Of Throat 3  Excess throat mucus or feeling of post nasal drip 3  Difficulty swallowing food, liquid or tablets 0  Cough after eating or lying down 5  Breathing difficulties or choking episodes 2  Troublesome or annoying cough 5  Sensation of something sticking in throat or lump in throat 5  Heartburn, chest pain, indigestion, or stomach acid coming up 5  TOTAL 28   No images to review   Results for  JWAUN, VANZANTE (MRN [id number]) as of 07/02/2018 10:27  Ref. Range 02/06/2015 19:13  Creatinine Latest Ref Range: 0.61 - 1.24 mg/dL 4.19   Results for KWEISI, KRIBS (MRN [id number]) as of 07/02/2018 10:27  Ref. Range 02/06/2015 19:13  Hemoglobin Latest Ref Range: 13.0 - 18.0 g/dL 35.3     has no past medical history on file.   reports that he has been smoking. He has a 30.00 pack-year smoking history. He has never used smokeless tobacco.  Past Surgical History:  Procedure Laterality Date  . ANKLE SURGERY      No Known Allergies   There is no immunization history on file for this patient.  Family History  Problem Relation Age of Onset  . Hypertension Father      Current Outpatient Medications:  .  dextromethorphan-guaiFENesin (MUCINEX DM) 30-600 MG 12hr tablet, Take 1 tablet by mouth 2 (two) times daily., Disp: , Rfl:    Review of Systems  Constitutional: Positive for fever. Negative for unexpected weight change.  HENT: Positive for sinus pressure, sore throat and trouble swallowing. Negative for congestion, dental problem, ear pain, nosebleeds, postnasal drip, rhinorrhea and sneezing.   Eyes: Negative for redness and  itching.  Respiratory: Positive for cough and wheezing. Negative for chest tightness and shortness of breath.   Cardiovascular: Negative for palpitations and leg swelling.  Gastrointestinal: Positive for nausea and vomiting.  Genitourinary: Negative for dysuria.  Musculoskeletal: Negative for joint swelling.  Skin: Negative for rash.  Allergic/Immunologic: Negative.  Negative for environmental allergies, food allergies and immunocompromised state.  Neurological: Negative for headaches.  Hematological: Does not bruise/bleed easily.  Psychiatric/Behavioral: Negative for dysphoric mood. The patient is not nervous/anxious.        Objective:   Physical Exam  Vitals:   07/02/18 1003  BP: 116/68  Pulse: 80  SpO2: 96%  Weight: 186 lb 6.4 oz (84.6 kg)    Height: 5\' 11"  (1.803 m)    Body mass index is 26 kg/m.   General Appearance:    Alert, cooperative, no distress, appears stated age - yes , sitting on - chair  Head:    Normocephalic, without obvious abnormality, atraumatic  Eyes:    PERRL, conjunctiva/corneas clear,  Ears:    Normal TM's and external ear canals, both ears  Nose:   Nares normal, septum midline, mucosa normal, no drainage    or sinus tenderness. OXYGEN ON no @ ra  Throat:   Lips, mucosa, and tongue normal; teeth and gums normal. Cyanosis on lips - no. SMells of tobacco +  Neck:   Supple, symmetrical, trachea midline, no adenopathy;    thyroid:  no enlargement/tenderness/nodules; no carotid   bruit or JVD  Back:     Symmetric, no curvature, ROM normal, no CVA tenderness  Lungs:     Distress - no , Wheeze no, Barrell Chest - no, Purse lip breathing - no, Crackles - no . R LUNG BASE - PLEURAL RUB v WHEEZE   Chest Wall:    No tenderness or deformity. Scars in chest no   Heart:    Regular rate and rhythm, S1 and S2 normal, no murmur, rub   or gallop  Breast Exam:    NOT DONE  Abdomen:     Soft, non-tender, bowel sounds active all four quadrants,    no masses, no organomegaly  Genitalia:   NOT DONE  Rectal:   NOT DONE  Extremities:   Extremities normal, atraumatic, Clubbing - no, Edema - no  Pulses:   2+ and symmetric all extremities  Skin:   Stigmata of Connective Tissue Disease - no  Lymph nodes:   Cervical, supraclavicular, and axillary nodes normal  Psychiatric:  Neurologic:   pleasant CNII-XII intact, normal strength, sensation  throughout             Assessment & Plan:   Patient Instructions     ICD-10-CM   1. History of smoking 30 or more pack years Z87.891   2. Cough R05   3. Pleural rub R09.89    Likely post viral reactive cough Rule out copd or pneumonia or pleurisy  PLAN  Do cbc with diff  Do CXR 2 view  Z pak  Please take prednisone 40 mg x1 day, then 30 mg x1 day, then 20 mg x1  day, then 10 mg x1 day, and then 5 mg x1 day and stop  Do PFT in 6 weeks  Followup PFT in 6 weeks Return to see me or nurse practitioner in 6 weeks       SIGNATURE    Dr. Kalman ShanMurali Vonita Calloway, M.D., F.C.C.P,  Pulmonary and Critical Care Medicine Staff Physician, [facility] Center Director - Interstitial Lung  Disease  Program  Pulmonary Fibrosis [facility] at [facility] [place], [place], [id number]  Pager: [phone], If no answer or between  15:00h - 7:00h: call [phone] Telephone: [phone]  10:47 AM 07/02/2018

## 2018-07-02 NOTE — Telephone Encounter (Signed)
LEt Christian Stewart know   ccbc normal cxr clear  No change in plan    SIGNATURE    Dr. Kalman Shan, M.D., F.C.C.P,  Pulmonary and Critical Care Medicine Staff Physician, [facility] Center Director - Interstitial Lung Disease  Program  Pulmonary Fibrosis [facility] at [facility] [place], [place], [address]  Pager: [phone], If no answer or between  15:00h - 7:00h: call [phone] Telephone: [phone]  4:46 PM 07/02/2018  g   LABS    PULMONARY No results for input(s): PHART, PCO2ART, PO2ART, HCO3, TCO2, O2SAT in the last 168 hours.  Invalid input(s): PCO2, PO2  CBC Recent Labs  Lab 07/02/18 1054  HGB 16.0  HCT 45.9  WBC 9.9  PLT 478.0*    COAGULATION No results for input(s): INR in the last 168 hours.  CARDIAC  No results for input(s): TROPONINI in the last 168 hours. No results for input(s): PROBNP in the last 168 hours.   CHEMISTRY No results for input(s): NA, K, CL, CO2, GLUCOSE, BUN, CREATININE, CALCIUM, MG, PHOS in the last 168 hours. CrCl cannot be calculated (Patient's most recent lab result is older than the maximum 21 days allowed.).   LIVER No results for input(s): AST, ALT, ALKPHOS, BILITOT, PROT, ALBUMIN, INR in the last 168 hours.   INFECTIOUS No results for input(s): LATICACIDVEN, PROCALCITON in the last 168 hours.   ENDOCRINE CBG (last 3)  No results for input(s): GLUCAP in the last 72 hours.       IMAGING x48h  - image(s) personally visualized  -   highlighted in bold Dg Chest 2 View  Result Date: 07/02/2018 CLINICAL DATA:  Cough for 5 days. EXAM: CHEST - 2 VIEW COMPARISON:  None. FINDINGS: The heart size and mediastinal contours are within normal limits. There is no focal infiltrate, pulmonary edema, or pleural effusion. The visualized skeletal structures are unremarkable. IMPRESSION: No active cardiopulmonary disease. Electronically Signed   By: Sherian Rein M.D.   On:  07/02/2018 13:41

## 2018-07-13 NOTE — Telephone Encounter (Signed)
ATC pt, no answer. Left message for pt to call back.  I wanted to make sure this was addresses before closing the encounter.

## 2018-07-16 NOTE — Telephone Encounter (Signed)
Attempted to call pt but unable to reach. Left message for pt to return call. 

## 2018-07-20 NOTE — Telephone Encounter (Signed)
Attempted to call pt but unable to reach.  Left a detailed message on pt's machine with the results of labwork and cxr. Nothing further needed.

## 2018-08-13 ENCOUNTER — Ambulatory Visit: Payer: 59 | Admitting: Nurse Practitioner

## 2020-02-17 ENCOUNTER — Other Ambulatory Visit: Payer: Self-pay

## 2020-02-17 ENCOUNTER — Other Ambulatory Visit: Payer: 59

## 2020-02-17 DIAGNOSIS — Z20822 Contact with and (suspected) exposure to covid-19: Secondary | ICD-10-CM

## 2020-02-18 LAB — NOVEL CORONAVIRUS, NAA: SARS-CoV-2, NAA: NOT DETECTED

## 2020-06-20 ENCOUNTER — Emergency Department (HOSPITAL_COMMUNITY): Payer: No Typology Code available for payment source

## 2020-06-20 ENCOUNTER — Encounter (HOSPITAL_COMMUNITY): Payer: Self-pay

## 2020-06-20 ENCOUNTER — Inpatient Hospital Stay (HOSPITAL_COMMUNITY)
Admission: EM | Admit: 2020-06-20 | Discharge: 2020-06-22 | DRG: 958 | Disposition: A | Discharge status: Home / Self Care | Payer: No Typology Code available for payment source | Attending: Surgery | Admitting: Surgery

## 2020-06-20 DIAGNOSIS — Z8249 Family history of ischemic heart disease and other diseases of the circulatory system: Secondary | ICD-10-CM

## 2020-06-20 DIAGNOSIS — S61411A Laceration without foreign body of right hand, initial encounter: Secondary | ICD-10-CM | POA: Diagnosis present

## 2020-06-20 DIAGNOSIS — Y92410 Unspecified street and highway as the place of occurrence of the external cause: Secondary | ICD-10-CM

## 2020-06-20 DIAGNOSIS — I739 Peripheral vascular disease, unspecified: Secondary | ICD-10-CM | POA: Diagnosis present

## 2020-06-20 DIAGNOSIS — S32019A Unspecified fracture of first lumbar vertebra, initial encounter for closed fracture: Secondary | ICD-10-CM | POA: Diagnosis present

## 2020-06-20 DIAGNOSIS — T797XXA Traumatic subcutaneous emphysema, initial encounter: Secondary | ICD-10-CM | POA: Diagnosis present

## 2020-06-20 DIAGNOSIS — S32029A Unspecified fracture of second lumbar vertebra, initial encounter for closed fracture: Secondary | ICD-10-CM | POA: Diagnosis present

## 2020-06-20 DIAGNOSIS — J9811 Atelectasis: Secondary | ICD-10-CM | POA: Diagnosis present

## 2020-06-20 DIAGNOSIS — S2242XA Multiple fractures of ribs, left side, initial encounter for closed fracture: Secondary | ICD-10-CM | POA: Diagnosis not present

## 2020-06-20 DIAGNOSIS — T1490XA Injury, unspecified, initial encounter: Secondary | ICD-10-CM | POA: Diagnosis not present

## 2020-06-20 DIAGNOSIS — S36892A Contusion of other intra-abdominal organs, initial encounter: Secondary | ICD-10-CM | POA: Diagnosis present

## 2020-06-20 DIAGNOSIS — Y903 Blood alcohol level of 60-79 mg/100 ml: Secondary | ICD-10-CM | POA: Diagnosis present

## 2020-06-20 DIAGNOSIS — F17210 Nicotine dependence, cigarettes, uncomplicated: Secondary | ICD-10-CM | POA: Diagnosis present

## 2020-06-20 DIAGNOSIS — S27322A Contusion of lung, bilateral, initial encounter: Secondary | ICD-10-CM | POA: Diagnosis present

## 2020-06-20 DIAGNOSIS — R58 Hemorrhage, not elsewhere classified: Secondary | ICD-10-CM | POA: Diagnosis present

## 2020-06-20 DIAGNOSIS — S22029A Unspecified fracture of second thoracic vertebra, initial encounter for closed fracture: Secondary | ICD-10-CM | POA: Diagnosis present

## 2020-06-20 DIAGNOSIS — S270XXA Traumatic pneumothorax, initial encounter: Secondary | ICD-10-CM

## 2020-06-20 DIAGNOSIS — Z23 Encounter for immunization: Secondary | ICD-10-CM

## 2020-06-20 DIAGNOSIS — Z20822 Contact with and (suspected) exposure to covid-19: Secondary | ICD-10-CM | POA: Diagnosis present

## 2020-06-20 DIAGNOSIS — F10129 Alcohol abuse with intoxication, unspecified: Secondary | ICD-10-CM | POA: Diagnosis present

## 2020-06-20 DIAGNOSIS — Z79899 Other long term (current) drug therapy: Secondary | ICD-10-CM

## 2020-06-20 LAB — CBC
HCT: 48.3 % (ref 39.0–52.0)
Hemoglobin: 16 g/dL (ref 13.0–17.0)
MCH: 31.4 pg (ref 26.0–34.0)
MCHC: 33.1 g/dL (ref 30.0–36.0)
MCV: 94.9 fL (ref 80.0–100.0)
Platelets: 316 10*3/uL (ref 150–400)
RBC: 5.09 MIL/uL (ref 4.22–5.81)
RDW: 13.5 % (ref 11.5–15.5)
WBC: 12.5 10*3/uL — ABNORMAL HIGH (ref 4.0–10.5)
nRBC: 0 % (ref 0.0–0.2)

## 2020-06-20 LAB — COMPREHENSIVE METABOLIC PANEL
ALT: 68 U/L — ABNORMAL HIGH (ref 0–44)
AST: 70 U/L — ABNORMAL HIGH (ref 15–41)
Albumin: 3.8 g/dL (ref 3.5–5.0)
Alkaline Phosphatase: 95 U/L (ref 38–126)
Anion gap: 13 (ref 5–15)
BUN: 6 mg/dL (ref 6–20)
CO2: 22 mmol/L (ref 22–32)
Calcium: 8.2 mg/dL — ABNORMAL LOW (ref 8.9–10.3)
Chloride: 95 mmol/L — ABNORMAL LOW (ref 98–111)
Creatinine, Ser: 0.91 mg/dL (ref 0.61–1.24)
GFR, Estimated: >60 mL/min (ref >60)
Glucose, Bld: 150 mg/dL — ABNORMAL HIGH (ref 70–99)
Potassium: 3.9 mmol/L (ref 3.5–5.1)
Sodium: 130 mmol/L — ABNORMAL LOW (ref 135–145)
Total Bilirubin: 0.5 mg/dL (ref 0.3–1.2)
Total Protein: 7.1 g/dL (ref 6.5–8.1)

## 2020-06-20 LAB — ETHANOL: Alcohol, Ethyl (B): 65 mg/dL — ABNORMAL HIGH (ref <10)

## 2020-06-20 MED ORDER — LIDOCAINE HCL (PF) 1 % IJ SOLN
10.0000 mL | Freq: Once | INTRAMUSCULAR | Status: AC
  Administered 2020-06-20: 10 mL
  Filled 2020-06-20: qty 10

## 2020-06-20 MED ORDER — TETANUS-DIPHTH-ACELL PERTUSSIS 5-2.5-18.5 LF-MCG/0.5 IM SUSY
0.5000 mL | PREFILLED_SYRINGE | Freq: Once | INTRAMUSCULAR | Status: AC
  Administered 2020-06-20: 0.5 mL via INTRAMUSCULAR
  Filled 2020-06-20: qty 0.5

## 2020-06-20 MED ORDER — IOHEXOL 300 MG/ML  SOLN
100.0000 mL | Freq: Once | INTRAMUSCULAR | Status: AC | PRN
  Administered 2020-06-20: 100 mL via INTRAVENOUS

## 2020-06-20 MED ORDER — HYDROMORPHONE HCL 1 MG/ML IJ SOLN
1.0000 mg | Freq: Once | INTRAMUSCULAR | Status: AC
  Administered 2020-06-20: 1 mg via INTRAVENOUS
  Filled 2020-06-20: qty 1

## 2020-06-20 NOTE — ED Notes (Signed)
c-collar applied in triage

## 2020-06-20 NOTE — ED Notes (Signed)
Patient transported to CT 
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
## 2020-06-20 NOTE — ED Notes (Signed)
In CT will update vital when back.
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
## 2020-06-21 ENCOUNTER — Inpatient Hospital Stay (HOSPITAL_COMMUNITY): Payer: No Typology Code available for payment source

## 2020-06-21 DIAGNOSIS — S32019A Unspecified fracture of first lumbar vertebra, initial encounter for closed fracture: Secondary | ICD-10-CM | POA: Diagnosis present

## 2020-06-21 DIAGNOSIS — S27322A Contusion of lung, bilateral, initial encounter: Secondary | ICD-10-CM | POA: Diagnosis present

## 2020-06-21 DIAGNOSIS — T797XXA Traumatic subcutaneous emphysema, initial encounter: Secondary | ICD-10-CM | POA: Diagnosis present

## 2020-06-21 DIAGNOSIS — S36892A Contusion of other intra-abdominal organs, initial encounter: Secondary | ICD-10-CM | POA: Diagnosis present

## 2020-06-21 DIAGNOSIS — Z79899 Other long term (current) drug therapy: Secondary | ICD-10-CM | POA: Diagnosis not present

## 2020-06-21 DIAGNOSIS — S22029A Unspecified fracture of second thoracic vertebra, initial encounter for closed fracture: Secondary | ICD-10-CM | POA: Diagnosis present

## 2020-06-21 DIAGNOSIS — J9811 Atelectasis: Secondary | ICD-10-CM | POA: Diagnosis present

## 2020-06-21 DIAGNOSIS — S2242XA Multiple fractures of ribs, left side, initial encounter for closed fracture: Secondary | ICD-10-CM | POA: Diagnosis present

## 2020-06-21 DIAGNOSIS — S32029A Unspecified fracture of second lumbar vertebra, initial encounter for closed fracture: Secondary | ICD-10-CM | POA: Diagnosis present

## 2020-06-21 DIAGNOSIS — I739 Peripheral vascular disease, unspecified: Secondary | ICD-10-CM | POA: Diagnosis present

## 2020-06-21 DIAGNOSIS — F10129 Alcohol abuse with intoxication, unspecified: Secondary | ICD-10-CM | POA: Diagnosis present

## 2020-06-21 DIAGNOSIS — Z20822 Contact with and (suspected) exposure to covid-19: Secondary | ICD-10-CM | POA: Diagnosis present

## 2020-06-21 DIAGNOSIS — R58 Hemorrhage, not elsewhere classified: Secondary | ICD-10-CM | POA: Diagnosis present

## 2020-06-21 DIAGNOSIS — Y903 Blood alcohol level of 60-79 mg/100 ml: Secondary | ICD-10-CM | POA: Diagnosis present

## 2020-06-21 DIAGNOSIS — Z23 Encounter for immunization: Secondary | ICD-10-CM | POA: Diagnosis not present

## 2020-06-21 DIAGNOSIS — T1490XA Injury, unspecified, initial encounter: Secondary | ICD-10-CM | POA: Diagnosis present

## 2020-06-21 DIAGNOSIS — F17210 Nicotine dependence, cigarettes, uncomplicated: Secondary | ICD-10-CM | POA: Diagnosis present

## 2020-06-21 DIAGNOSIS — Y92410 Unspecified street and highway as the place of occurrence of the external cause: Secondary | ICD-10-CM | POA: Diagnosis not present

## 2020-06-21 DIAGNOSIS — S61411A Laceration without foreign body of right hand, initial encounter: Secondary | ICD-10-CM | POA: Diagnosis present

## 2020-06-21 DIAGNOSIS — Z8249 Family history of ischemic heart disease and other diseases of the circulatory system: Secondary | ICD-10-CM | POA: Diagnosis not present

## 2020-06-21 LAB — CBC
HCT: 40.2 % (ref 39.0–52.0)
Hemoglobin: 14.2 g/dL (ref 13.0–17.0)
MCH: 32.1 pg (ref 26.0–34.0)
MCHC: 35.3 g/dL (ref 30.0–36.0)
MCV: 91 fL (ref 80.0–100.0)
Platelets: 273 10*3/uL (ref 150–400)
RBC: 4.42 MIL/uL (ref 4.22–5.81)
RDW: 13.6 % (ref 11.5–15.5)
WBC: 12.1 10*3/uL — ABNORMAL HIGH (ref 4.0–10.5)
nRBC: 0 % (ref 0.0–0.2)

## 2020-06-21 LAB — RESP PANEL BY RT-PCR (FLU A&B, COVID) ARPGX2
Influenza A by PCR: NEGATIVE
Influenza B by PCR: NEGATIVE
SARS Coronavirus 2 by RT PCR: NEGATIVE

## 2020-06-21 LAB — HIV ANTIBODY (ROUTINE TESTING W REFLEX): HIV Screen 4th Generation wRfx: NONREACTIVE

## 2020-06-21 LAB — PHOSPHORUS: Phosphorus: 3 mg/dL (ref 2.5–4.6)

## 2020-06-21 LAB — MAGNESIUM: Magnesium: 1.9 mg/dL (ref 1.7–2.4)

## 2020-06-21 MED ORDER — PROCHLORPERAZINE EDISYLATE 10 MG/2ML IJ SOLN: 5.0000 mg | Freq: Four times a day (QID) | INTRAMUSCULAR | Status: DC | PRN

## 2020-06-21 MED ORDER — ACETAMINOPHEN 325 MG PO TABS: 650.0000 mg | ORAL_TABLET | ORAL | Status: DC | PRN

## 2020-06-21 MED ORDER — LIDOCAINE 5 % EX PTCH
1.0000 | MEDICATED_PATCH | CUTANEOUS | Status: DC
  Administered 2020-06-21: 1 via TRANSDERMAL
  Filled 2020-06-21 (×2): qty 1

## 2020-06-21 MED ORDER — FOLIC ACID 1 MG PO TABS
1.0000 mg | ORAL_TABLET | Freq: Every day | ORAL | Status: DC
  Administered 2020-06-21: 1 mg via ORAL
  Filled 2020-06-21: qty 1

## 2020-06-21 MED ORDER — ENOXAPARIN SODIUM 30 MG/0.3ML ~~LOC~~ SOLN: 30.0000 mg | Freq: Two times a day (BID) | SUBCUTANEOUS | Status: DC

## 2020-06-21 MED ORDER — ONDANSETRON 4 MG PO TBDP: 4.0000 mg | ORAL_TABLET | Freq: Four times a day (QID) | ORAL | Status: DC | PRN

## 2020-06-21 MED ORDER — GUAIFENESIN ER 600 MG PO TB12
600.0000 mg | ORAL_TABLET | Freq: Two times a day (BID) | ORAL | Status: DC
Start: 2020-06-21 — End: 2020-06-22
  Administered 2020-06-21 (×2): 600 mg via ORAL
  Filled 2020-06-21 (×2): qty 1

## 2020-06-21 MED ORDER — MORPHINE SULFATE (PF) 2 MG/ML IV SOLN
1.0000 mg | INTRAVENOUS | Status: DC | PRN
  Administered 2020-06-21 (×2): 2 mg via INTRAVENOUS
  Filled 2020-06-21 (×3): qty 1

## 2020-06-21 MED ORDER — IBUPROFEN 400 MG PO TABS
400.0000 mg | ORAL_TABLET | Freq: Three times a day (TID) | ORAL | Status: DC
  Administered 2020-06-21 (×3): 400 mg via ORAL
  Filled 2020-06-21 (×3): qty 1

## 2020-06-21 MED ORDER — OXYCODONE HCL 5 MG PO TABS: 5.0000 mg | ORAL_TABLET | ORAL | Status: DC | PRN

## 2020-06-21 MED ORDER — THIAMINE HCL 100 MG PO TABS
100.0000 mg | ORAL_TABLET | Freq: Every day | ORAL | Status: DC
  Administered 2020-06-21: 100 mg via ORAL
  Filled 2020-06-21: qty 1

## 2020-06-21 MED ORDER — THIAMINE HCL 100 MG/ML IJ SOLN: 100.0000 mg | Freq: Every day | INTRAMUSCULAR | Status: DC

## 2020-06-21 MED ORDER — HYDRALAZINE HCL 25 MG PO TABS: 25.0000 mg | ORAL_TABLET | Freq: Four times a day (QID) | ORAL | Status: DC | PRN

## 2020-06-21 MED ORDER — PANTOPRAZOLE SODIUM 40 MG PO TBEC
40.0000 mg | DELAYED_RELEASE_TABLET | Freq: Every day | ORAL | Status: DC
  Administered 2020-06-21: 40 mg via ORAL
  Filled 2020-06-21: qty 1

## 2020-06-21 MED ORDER — ADULT MULTIVITAMIN W/MINERALS CH
1.0000 | ORAL_TABLET | Freq: Every day | ORAL | Status: DC
  Administered 2020-06-21: 1 via ORAL
  Filled 2020-06-21: qty 1

## 2020-06-21 MED ORDER — SODIUM CHLORIDE 0.9% FLUSH: 3.0000 mL | INTRAVENOUS | Status: DC | PRN

## 2020-06-21 MED ORDER — PROCHLORPERAZINE MALEATE 10 MG PO TABS
10.0000 mg | ORAL_TABLET | Freq: Four times a day (QID) | ORAL | Status: DC | PRN
  Filled 2020-06-21: qty 1

## 2020-06-21 MED ORDER — DIPHENHYDRAMINE HCL 25 MG PO CAPS: 25.0000 mg | ORAL_CAPSULE | Freq: Four times a day (QID) | ORAL | Status: DC | PRN

## 2020-06-21 MED ORDER — IBUPROFEN 400 MG PO TABS: 400.0000 mg | ORAL_TABLET | Freq: Four times a day (QID) | ORAL | Status: DC | PRN

## 2020-06-21 MED ORDER — BISACODYL 10 MG RE SUPP
10.0000 mg | Freq: Every day | RECTAL | Status: DC | PRN
Start: 2020-06-21 — End: 2020-06-22

## 2020-06-21 MED ORDER — LORAZEPAM 2 MG/ML IJ SOLN: 1.0000 mg | INTRAMUSCULAR | Status: DC | PRN

## 2020-06-21 MED ORDER — PANTOPRAZOLE SODIUM 40 MG IV SOLR: 40.0000 mg | Freq: Every day | INTRAVENOUS | Status: DC

## 2020-06-21 MED ORDER — SODIUM CHLORIDE 0.9 % IV SOLN: 250.0000 mL | INTRAVENOUS | Status: DC | PRN

## 2020-06-21 MED ORDER — ONDANSETRON HCL 4 MG/2ML IJ SOLN: 4.0000 mg | Freq: Four times a day (QID) | INTRAMUSCULAR | Status: DC | PRN

## 2020-06-21 MED ORDER — SODIUM CHLORIDE 0.9% FLUSH
3.0000 mL | Freq: Two times a day (BID) | INTRAVENOUS | Status: DC
  Administered 2020-06-21 (×3): 3 mL via INTRAVENOUS

## 2020-06-21 MED ORDER — DOCUSATE SODIUM 100 MG PO CAPS
100.0000 mg | ORAL_CAPSULE | Freq: Two times a day (BID) | ORAL | Status: DC
  Administered 2020-06-21 (×2): 100 mg via ORAL
  Filled 2020-06-21 (×2): qty 1

## 2020-06-21 MED ORDER — ACETAMINOPHEN 325 MG PO TABS
650.0000 mg | ORAL_TABLET | Freq: Four times a day (QID) | ORAL | Status: DC
  Administered 2020-06-21 – 2020-06-22 (×4): 650 mg via ORAL
  Filled 2020-06-21 (×4): qty 2

## 2020-06-21 MED ORDER — LORAZEPAM 1 MG PO TABS: 1.0000 mg | ORAL_TABLET | ORAL | Status: DC | PRN

## 2020-06-21 MED ORDER — METHOCARBAMOL 500 MG PO TABS
1000.0000 mg | ORAL_TABLET | Freq: Four times a day (QID) | ORAL | Status: DC
  Administered 2020-06-21 (×4): 1000 mg via ORAL
  Filled 2020-06-21 (×4): qty 2
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
## 2020-06-22 ENCOUNTER — Encounter (HOSPITAL_COMMUNITY): Payer: Self-pay

## 2020-06-22 ENCOUNTER — Other Ambulatory Visit: Payer: Self-pay

## 2020-06-22 LAB — CBC
HCT: 41.2 % (ref 39.0–52.0)
Hemoglobin: 14.6 g/dL (ref 13.0–17.0)
MCH: 32.4 pg (ref 26.0–34.0)
MCHC: 35.4 g/dL (ref 30.0–36.0)
MCV: 91.4 fL (ref 80.0–100.0)
Platelets: 261 10*3/uL (ref 150–400)
RBC: 4.51 MIL/uL (ref 4.22–5.81)
RDW: 13.5 % (ref 11.5–15.5)
WBC: 10.5 10*3/uL (ref 4.0–10.5)
nRBC: 0 % (ref 0.0–0.2)

## 2020-06-22 LAB — BASIC METABOLIC PANEL
Anion gap: 12 (ref 5–15)
BUN: 9 mg/dL (ref 6–20)
CO2: 24 mmol/L (ref 22–32)
Calcium: 8.9 mg/dL (ref 8.9–10.3)
Chloride: 94 mmol/L — ABNORMAL LOW (ref 98–111)
Creatinine, Ser: 0.83 mg/dL (ref 0.61–1.24)
GFR, Estimated: >60 mL/min (ref >60)
Glucose, Bld: 107 mg/dL — ABNORMAL HIGH (ref 70–99)
Potassium: 4.1 mmol/L (ref 3.5–5.1)
Sodium: 130 mmol/L — ABNORMAL LOW (ref 135–145)

## 2020-06-22 MED ORDER — POLYETHYLENE GLYCOL 3350 17 G PO PACK: 17.0000 g | PACK | Freq: Every day | ORAL | 0 refills | Status: DC | PRN

## 2020-06-22 MED ORDER — ACETAMINOPHEN 325 MG PO TABS: 650.0000 mg | ORAL_TABLET | Freq: Four times a day (QID) | ORAL | Status: AC | PRN

## 2020-06-22 MED ORDER — GUAIFENESIN ER 600 MG PO TB12: 600.0000 mg | ORAL_TABLET | Freq: Two times a day (BID) | ORAL | Status: DC | PRN
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
## 2020-06-22 NOTE — Discharge Instructions (Signed)
RIB FRACTURES  HOME INSTRUCTIONS   1. PAIN CONTROL:  1. Pain is best controlled by a usual combination of three different methods TOGETHER:  i. Ice/Heat ii. Over the counter pain medication iii. Prescription pain medication 2. You may experience some swelling and bruising in area of broken ribs. Ice packs or heating pads (30-60 minutes up to 6 times a day) will help. Use ice for the first few days to help decrease swelling and bruising, then switch to heat to help relax tight/sore spots and speed recovery. Some people prefer to use ice alone, heat alone, alternating between ice & heat. Experiment to what works for you. Swelling and bruising can take several weeks to resolve.  3. It is helpful to take an over-the-counter pain medication regularly for the first few weeks. Choose one of the following that works best for you:  i. Naproxen (Aleve, etc) Two 220mg tabs twice a day ii. Ibuprofen (Advil, etc) Three 200mg tabs four times a day (every meal & bedtime) iii. Acetaminophen (Tylenol, etc) 500-650mg four times a day (every meal & bedtime) 4. A prescription for pain medication (such as oxycodone, hydrocodone, etc) may be given to you upon discharge. Take your pain medication as prescribed.  i. If you are having problems/concerns with the prescription medicine (does not control pain, nausea, vomiting, rash, itching, etc), please call us [phone] to see if we need to switch you to a different pain medicine that will work better for you and/or control your side effect better. ii. If you need a refill on your pain medication, please contact your pharmacy. They will contact our office to request authorization. Prescriptions will not be filled after 5 pm or on week-ends. 1. Avoid getting constipated. When taking pain medications, it is common to experience some constipation. Increasing fluid intake and taking a fiber supplement (such as Metamucil, Citrucel, FiberCon, MiraLax, etc) 1-2 times a day  regularly will usually help prevent this problem from occurring. A mild laxative (prune juice, Milk of Magnesia, MiraLax, etc) should be taken according to package directions if there are no bowel movements after 48 hours.  2. Watch out for diarrhea. If you have many loose bowel movements, simplify your diet to bland foods & liquids for a few days. Stop any stool softeners and decrease your fiber supplement. Switching to mild anti-diarrheal medications (Kayopectate, Pepto Bismol) can help. If this worsens or does not improve, please call us. 3. FOLLOW UP  a. If a follow up appointment is needed one will be scheduled for you. If none is needed with our trauma team, please follow up with your primary care provider within 2-3 weeks from discharge. Please call [facility] at [phone] if you have any questions about follow up.  b. If you have any orthopedic or other injuries you will need to follow up as outlined in your follow up instructions.   WHEN TO CALL US [phone]:  1. Poor pain control 2. Reactions / problems with new medications (rash/itching, nausea, etc)  3. Fever over 101.5 F (38.5 C) 4. Worsening swelling or bruising 5. Worsening pain, productive cough, difficulty breathing or any other concerning symptoms  The clinic staff is available to answer your questions during regular business hours (8:30am-5pm). Please don't hesitate to call and ask to speak to one of our nurses for clinical concerns.  If you have a medical emergency, go to the nearest emergency room or call 911.  A surgeon from [facility] is always on call   at the hospitals   [facility]  [address], [address], , [place] [address] ?  MAIN: [phone] ? TOLL FREE: [phone] ?  FAX [phone]  [email or web address]      Information on Rib Fractures  A rib fracture is a break or crack in one of the bones of the ribs. The ribs are long, curved bones that  wrap around your chest and attach to your spine and your breastbone. The ribs protect your heart, lungs, and other organs in the chest. A broken or cracked rib is often painful but is not usually serious. Most rib fractures heal on their own over time. However, rib fractures can be more serious if multiple ribs are broken or if broken ribs move out of place and push against other structures or organs. What are the causes? This condition is caused by:  Repetitive movements with high force, such as pitching a baseball or having severe coughing spells.  A direct blow to the chest, such as a sports injury, a car accident, or a fall.  Cancer that has spread to the bones, which can weaken bones and cause them to break. What are the signs or symptoms? Symptoms of this condition include:  Pain when you breathe in or cough.  Pain when someone presses on the injured area.  Feeling short of breath. How is this diagnosed? This condition is diagnosed with a physical exam and medical history. Imaging tests may also be done, such as:  Chest X-ray.  CT scan.  MRI.  Bone scan.  Chest ultrasound. How is this treated? Treatment for this condition depends on the severity of the fracture. Most rib fractures usually heal on their own in 1-3 months. Sometimes healing takes longer if there is a cough that does not stop or if there are other activities that make the injury worse (aggravating factors). While you heal, you will be given medicines to control the pain. You will also be taught deep breathing exercises. Severe injuries may require hospitalization or surgery. Follow these instructions at home: Managing pain, stiffness, and swelling  If directed, apply ice to the injured area. ? Put ice in a plastic bag. ? Place a towel between your skin and the bag. ? Leave the ice on for 20 minutes, 2-3 times a day.  Take over-the-counter and prescription medicines only as told by your health care  provider. Activity  Avoid a lot of activity and any activities or movements that cause pain. Be careful during activities and avoid bumping the injured rib.  Slowly increase your activity as told by your health care provider. General instructions  Do deep breathing exercises as told by your health care provider. This helps prevent pneumonia, which is a common complication of a broken rib. Your health care provider may instruct you to: ? Take deep breaths several times a day. ? Try to cough several times a day, holding a pillow against the injured area. ? Use a device called incentive spirometer to practice deep breathing several times a day.  Drink enough fluid to keep your urine pale yellow.  Do not wear a rib belt or binder. These restrict breathing, which can lead to pneumonia.  Keep all follow-up visits as told by your health care provider. This is important. Contact a health care provider if:  You have a fever. Get help right away if:  You have difficulty breathing or you are short of breath.  You develop a cough that does   not stop, or you cough up thick or bloody sputum.  You have nausea, vomiting, or pain in your abdomen.  Your pain gets worse and medicine does not help. Summary  A rib fracture is a break or crack in one of the bones of the ribs.  A broken or cracked rib is often painful but is not usually serious.  Most rib fractures heal on their own over time.  Treatment for this condition depends on the severity of the fracture.  Avoid a lot of activity and any activities or movements that cause pain. This information is not intended to replace advice given to you by your health care provider. Make sure you discuss any questions you have with your health care provider. Document Released: 06/02/2005 Document Revised: 09/01/2016 Document Reviewed: 09/01/2016 Elsevier Interactive Patient Education  2019 [employer]  

## 2020-06-22 NOTE — Progress Notes (Signed)
Robyn Haber to be D/C'd Home per MD order.  Discussed with the patient and all questions fully answered.   VSS, Skin clean, dry and intact without evidence of skin break down, no evidence of skin tears noted. IV catheter discontinued intact. Site without signs and symptoms of complications. Dressing and pressure applied.   An After Visit Summary was printed and given to the patient.    D/C education completed with patient/family including follow up instructions, medication list, d/c activities limitations if indicated, with other d/c instructions as indicated by MD - patient able to verbalize understanding, all questions fully answered.    Patient instructed to return to ED, call 911, or call MD for any changes in condition.    Patient escorted by police.

## 2020-07-13 ENCOUNTER — Ambulatory Visit (INDEPENDENT_AMBULATORY_CARE_PROVIDER_SITE_OTHER): Payer: Self-pay | Admitting: Primary Care

## 2020-07-13 ENCOUNTER — Other Ambulatory Visit: Payer: Self-pay

## 2020-07-13 ENCOUNTER — Encounter (INDEPENDENT_AMBULATORY_CARE_PROVIDER_SITE_OTHER): Payer: Self-pay | Admitting: Primary Care

## 2020-07-13 VITALS — BP 148/88 | HR 94 | Temp 97.7°F | Ht 71.0 in | Wt 195.6 lb

## 2020-07-13 DIAGNOSIS — Z09 Encounter for follow-up examination after completed treatment for conditions other than malignant neoplasm: Secondary | ICD-10-CM

## 2020-07-13 DIAGNOSIS — R030 Elevated blood-pressure reading, without diagnosis of hypertension: Secondary | ICD-10-CM

## 2020-07-13 DIAGNOSIS — Z720 Tobacco use: Secondary | ICD-10-CM

## 2020-07-13 DIAGNOSIS — Z7689 Persons encountering health services in other specified circumstances: Secondary | ICD-10-CM
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
## 2020-07-13 NOTE — Patient Instructions (Signed)

## 2020-07-15 DIAGNOSIS — Z720 Tobacco use: Secondary | ICD-10-CM | POA: Insufficient documentation

## 2020-07-16 ENCOUNTER — Telehealth (INDEPENDENT_AMBULATORY_CARE_PROVIDER_SITE_OTHER): Payer: Self-pay

## 2020-07-16 NOTE — Telephone Encounter (Signed)
Copied from CRM [id number]. Topic: General - Other >> Jul 16, 2020  8:14 AM Jaquita Rector A wrote: Reason for CRM: Patient fiance Pedro Earls called in to inquire if it was possible for patient to come in at some point this week to be evaluated have his ribs checked out so he can be properly released to go back to work. Please call if patient can be seen today or sometime this week. Can be reached at  Ph# [phone]   Please advise patient had an appointment on Jan 28.

## 2020-07-17 ENCOUNTER — Other Ambulatory Visit (INDEPENDENT_AMBULATORY_CARE_PROVIDER_SITE_OTHER): Payer: Self-pay | Admitting: Primary Care

## 2020-07-17 NOTE — Telephone Encounter (Signed)
Please advise. Patient was recently on light duty at last OV.

## 2020-07-18 ENCOUNTER — Inpatient Hospital Stay: Payer: Self-pay | Admitting: Physician Assistant

## 2020-07-20 ENCOUNTER — Ambulatory Visit: Payer: 59 | Admitting: Family Medicine

## 2020-07-20 ENCOUNTER — Other Ambulatory Visit: Payer: Self-pay

## 2020-07-20 DIAGNOSIS — S2243XS Multiple fractures of ribs, bilateral, sequela: Secondary | ICD-10-CM | POA: Diagnosis not present

## 2020-07-20 DIAGNOSIS — S2243XD Multiple fractures of ribs, bilateral, subsequent encounter for fracture with routine healing: Secondary | ICD-10-CM | POA: Diagnosis not present
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
## 2020-07-20 NOTE — Patient Instructions (Signed)
   For bones:  Vitamin D3:  Take 5,000 IU daily  Vitamin K2:  Take 100 mcg daily  Magnesium:  Take 400 mg daily   Try to taper off cigarettes if possible.

## 2020-08-31 ENCOUNTER — Ambulatory Visit (INDEPENDENT_AMBULATORY_CARE_PROVIDER_SITE_OTHER): Payer: Self-pay | Admitting: Primary Care

## 2020-09-03 ENCOUNTER — Ambulatory Visit (INDEPENDENT_AMBULATORY_CARE_PROVIDER_SITE_OTHER): Payer: Self-pay | Admitting: Primary Care

## 2020-09-04 ENCOUNTER — Ambulatory Visit (INDEPENDENT_AMBULATORY_CARE_PROVIDER_SITE_OTHER): Payer: Self-pay | Admitting: Family Medicine
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
## 2023-10-17 ENCOUNTER — Ambulatory Visit
Admission: EM | Admit: 2023-10-17 | Discharge: 2023-10-17 | Disposition: A | Discharge status: Home / Self Care | Attending: Emergency Medicine | Admitting: Emergency Medicine

## 2023-10-17 ENCOUNTER — Other Ambulatory Visit: Payer: Self-pay

## 2023-10-17 DIAGNOSIS — H6123 Impacted cerumen, bilateral: Secondary | ICD-10-CM

## 2023-10-17 NOTE — ED Triage Notes (Signed)
 Ear pain and decreased hearing left ear x 3 days. Tried debrox at home
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
## 2023-10-17 NOTE — Discharge Instructions (Addendum)
 Please use the DeBrox ear drops two times daily for 5 days in a row If symptoms persist, return after day 5 to have another trial of irrigation  There is an Ear Nose and Throat clinic you can follow up with if needed.

## 2023-11-03 ENCOUNTER — Ambulatory Visit
Admission: EM | Admit: 2023-11-03 | Discharge: 2023-11-03 | Disposition: A | Discharge status: Home / Self Care | Attending: Family Medicine | Admitting: Family Medicine

## 2023-11-03 VITALS — BP 114/75 | HR 89 | Temp 97.5°F | Resp 18 | Ht 71.0 in | Wt 185.0 lb

## 2023-11-03 DIAGNOSIS — M7989 Other specified soft tissue disorders: Secondary | ICD-10-CM

## 2023-11-03 MED ORDER — CEPHALEXIN 500 MG PO CAPS: 500.0000 mg | ORAL_CAPSULE | Freq: Two times a day (BID) | ORAL | 0 refills | Status: AC

## 2023-11-03 MED ORDER — PREDNISONE 20 MG PO TABS: 40.0000 mg | ORAL_TABLET | Freq: Every day | ORAL | 0 refills | Status: AC

## 2023-11-03 NOTE — ED Triage Notes (Signed)
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
## 2023-11-03 NOTE — Discharge Instructions (Signed)
 Take prednisone 20 mg--2 daily for 5 days  Take cephalexin 500 mg--1 capsule 2 times daily for 7 days
